# Patient Record
Sex: Female | Born: 1975 | Race: White | Hispanic: No | Marital: Married | Smoking: Former smoker
Health system: Southern US, Community
[De-identification: ages and names within clinical notes are randomized; demographics above are authoritative.]

---

## 2016-05-13 ENCOUNTER — Other Ambulatory Visit: Payer: Self-pay | Admitting: Family Medicine

## 2016-05-13 DIAGNOSIS — E041 Nontoxic single thyroid nodule: Secondary | ICD-10-CM

## 2016-05-14 ENCOUNTER — Other Ambulatory Visit: Payer: Self-pay | Admitting: Family Medicine

## 2016-05-14 DIAGNOSIS — E041 Nontoxic single thyroid nodule: Secondary | ICD-10-CM

## 2016-05-16 ENCOUNTER — Ambulatory Visit
Admission: RE | Admit: 2016-05-16 | Discharge: 2016-05-16 | Disposition: A | Discharge status: Home / Self Care | Payer: BLUE CROSS/BLUE SHIELD | Source: Ambulatory Visit | Attending: Family Medicine | Admitting: Family Medicine

## 2016-05-16 DIAGNOSIS — E041 Nontoxic single thyroid nodule: Secondary | ICD-10-CM

## 2016-05-23 ENCOUNTER — Ambulatory Visit
Admission: RE | Admit: 2016-05-23 | Discharge: 2016-05-23 | Disposition: A | Discharge status: Home / Self Care | Payer: BLUE CROSS/BLUE SHIELD | Source: Ambulatory Visit | Attending: Family Medicine | Admitting: Family Medicine

## 2016-05-23 ENCOUNTER — Other Ambulatory Visit (HOSPITAL_COMMUNITY)
Admission: RE | Admit: 2016-05-23 | Discharge: 2016-05-23 | Disposition: A | Discharge status: Home / Self Care | Payer: BLUE CROSS/BLUE SHIELD | Source: Ambulatory Visit | Attending: Radiology | Admitting: Radiology

## 2016-05-23 DIAGNOSIS — E041 Nontoxic single thyroid nodule: Secondary | ICD-10-CM | POA: Diagnosis not present

## 2018-07-22 ENCOUNTER — Other Ambulatory Visit: Payer: Self-pay | Admitting: Family Medicine

## 2018-07-22 DIAGNOSIS — R1314 Dysphagia, pharyngoesophageal phase: Secondary | ICD-10-CM

## 2018-08-12 ENCOUNTER — Other Ambulatory Visit: Payer: BLUE CROSS/BLUE SHIELD

## 2018-08-19 ENCOUNTER — Ambulatory Visit
Admission: RE | Admit: 2018-08-19 | Discharge: 2018-08-19 | Disposition: A | Discharge status: Home / Self Care | Payer: BLUE CROSS/BLUE SHIELD | Source: Ambulatory Visit | Attending: Family Medicine | Admitting: Family Medicine

## 2018-08-19 DIAGNOSIS — R1314 Dysphagia, pharyngoesophageal phase: Secondary | ICD-10-CM

## 2020-09-11 ENCOUNTER — Other Ambulatory Visit: Payer: Self-pay | Admitting: Family Medicine

## 2020-09-11 DIAGNOSIS — Z122 Encounter for screening for malignant neoplasm of respiratory organs: Secondary | ICD-10-CM

## 2020-10-23 ENCOUNTER — Other Ambulatory Visit: Payer: Self-pay | Admitting: Family Medicine

## 2020-10-23 ENCOUNTER — Ambulatory Visit
Admission: RE | Admit: 2020-10-23 | Discharge: 2020-10-23 | Disposition: A | Discharge status: Home / Self Care | Payer: BC Managed Care – PPO | Source: Ambulatory Visit | Attending: Family Medicine | Admitting: Family Medicine

## 2020-10-23 DIAGNOSIS — M542 Cervicalgia: Secondary | ICD-10-CM

## 2021-07-25 IMAGING — DX DG CERVICAL SPINE COMPLETE 4+V
6 series · 6 of 6 positions shown · non-contrast
Comparison: None.

CLINICAL DATA: Chronic neck pain for 1 year

EXAM:
CERVICAL SPINE - COMPLETE 4+ VIEW

[dg cervical spine complete (1 of 6)]
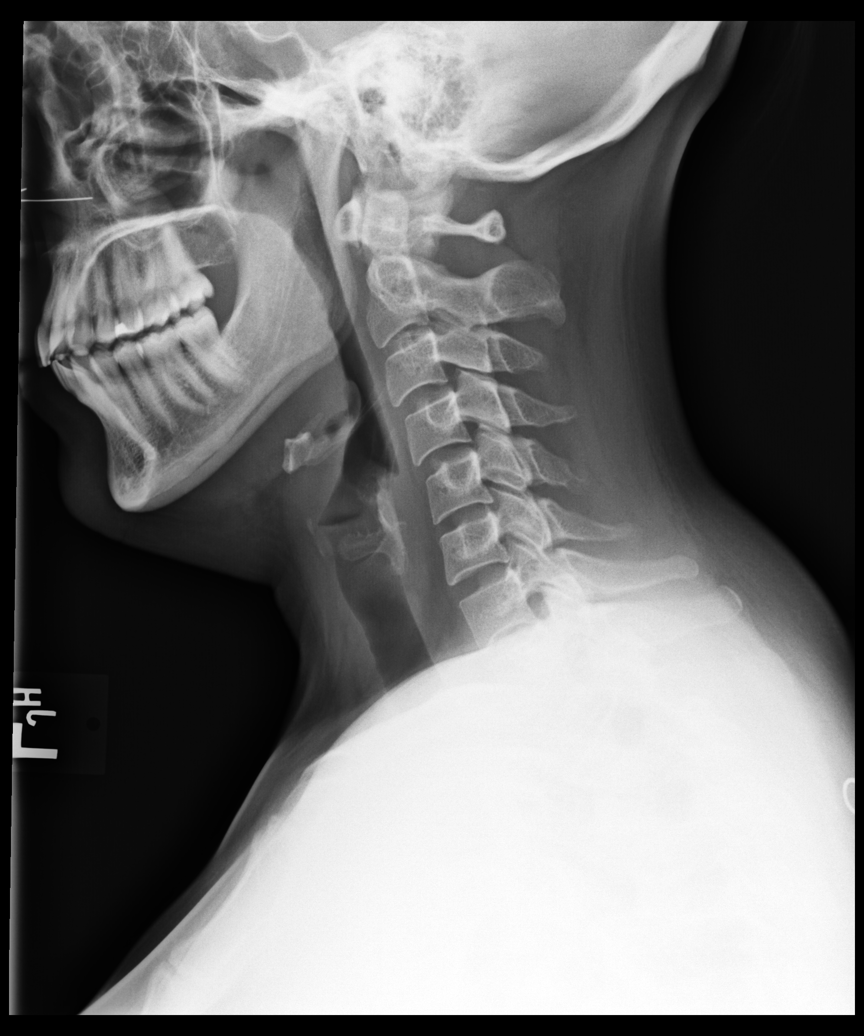

[dg cervical spine complete (2 of 6)]
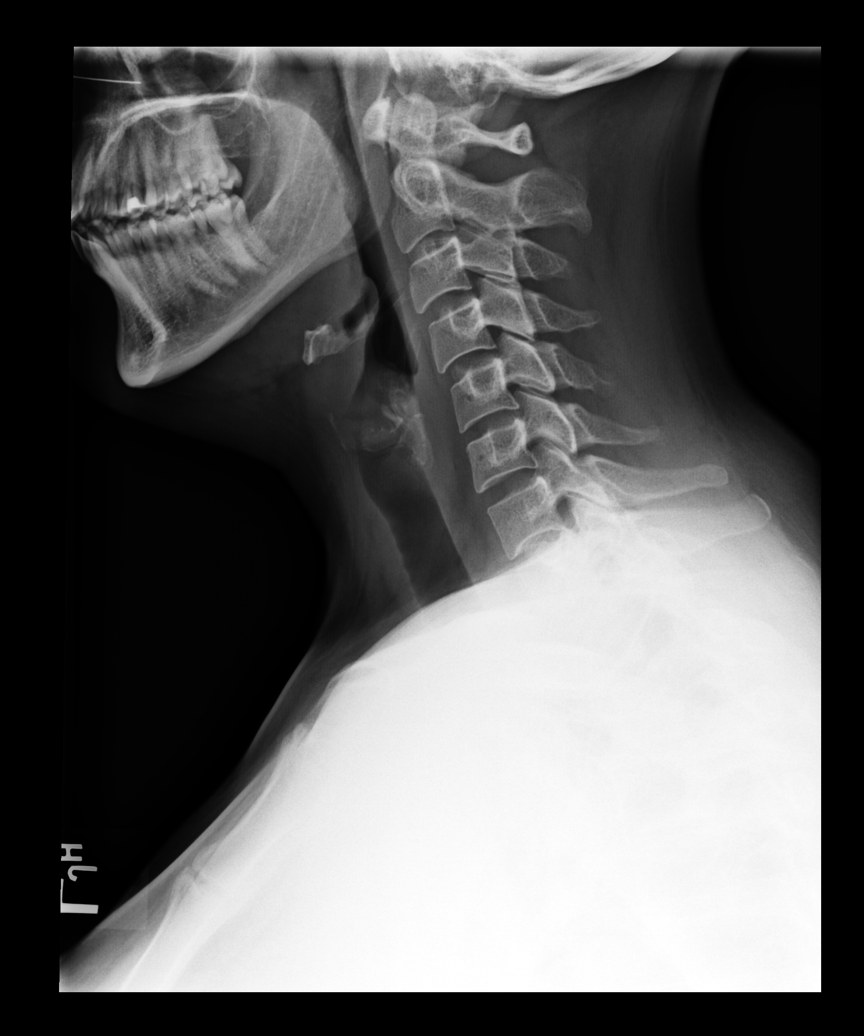

[dg cervical spine complete (3 of 6)]
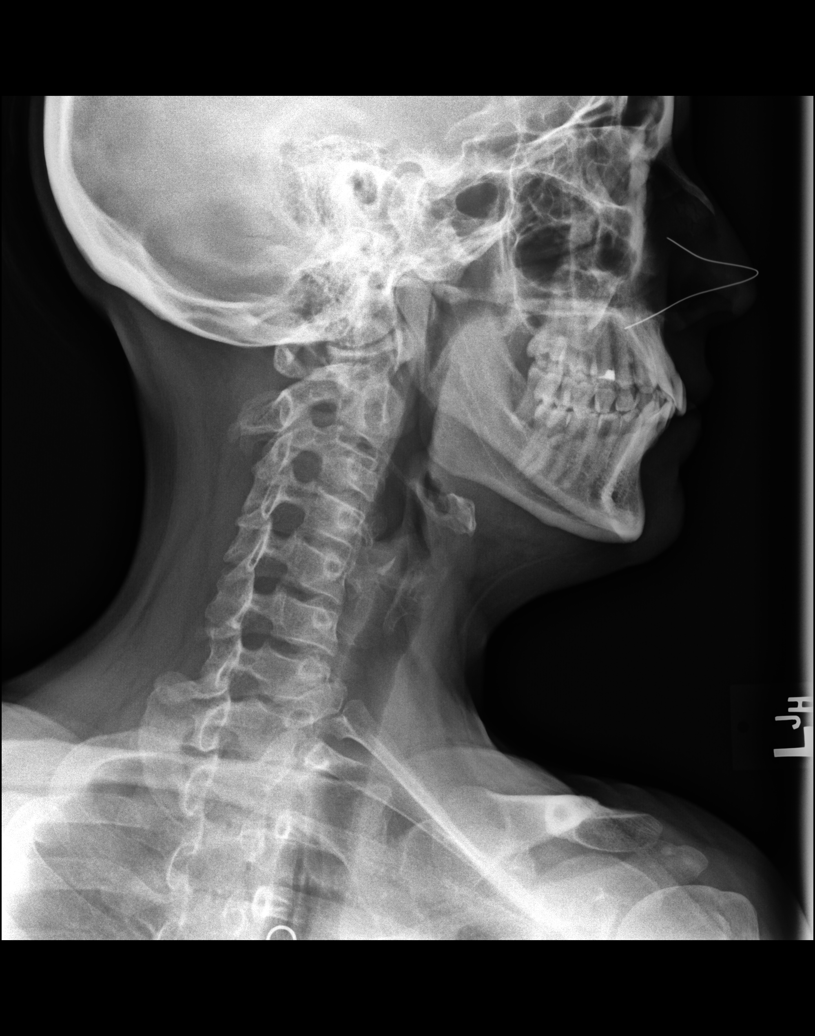

[dg cervical spine complete (4 of 6)]
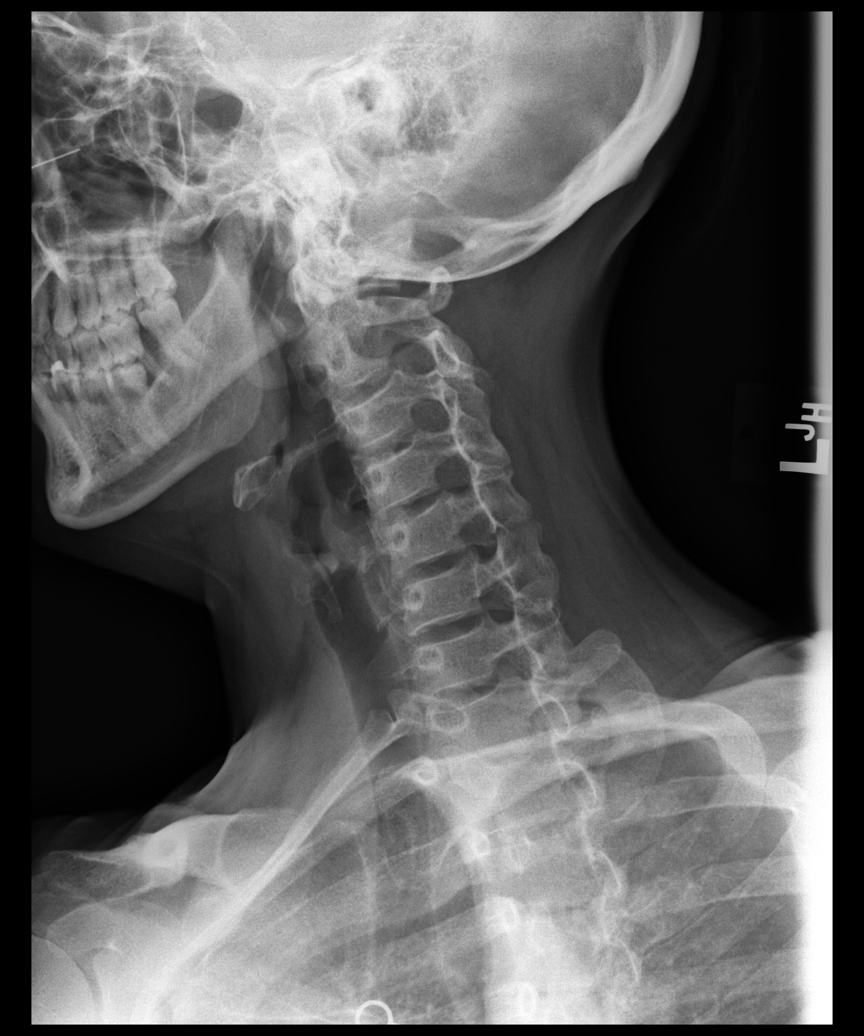

[dg cervical spine complete (5 of 6)]
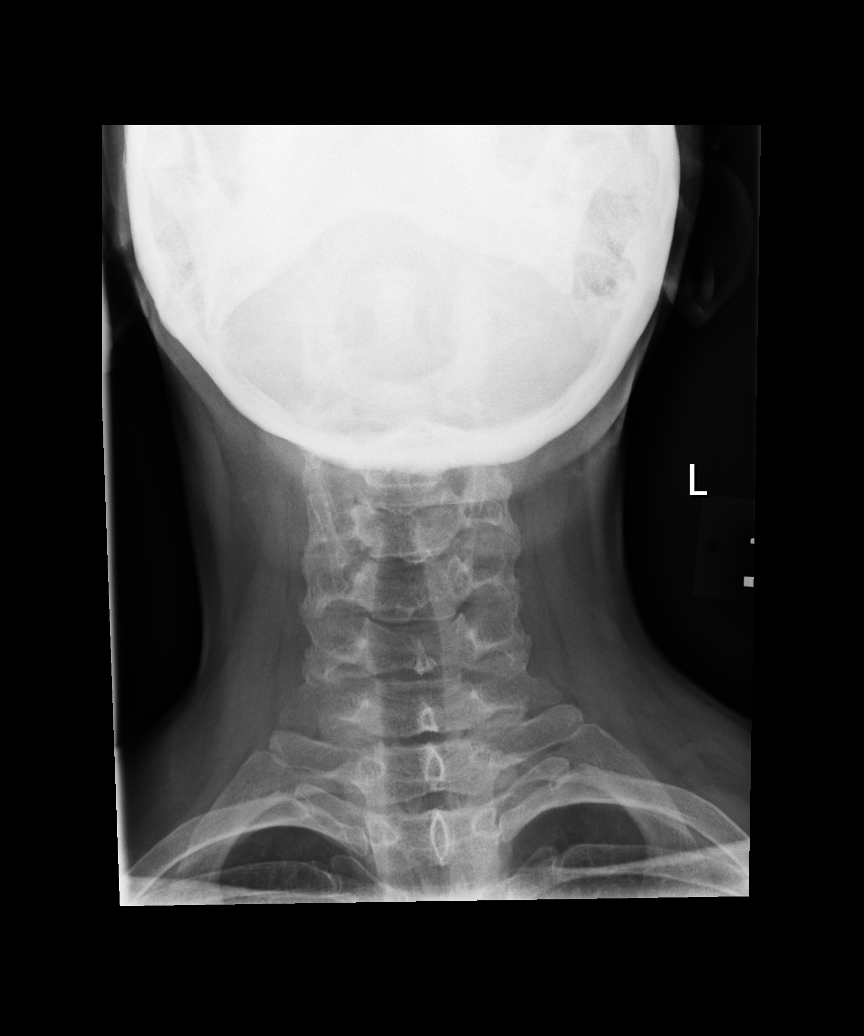

[dg cervical spine complete (6 of 6)]
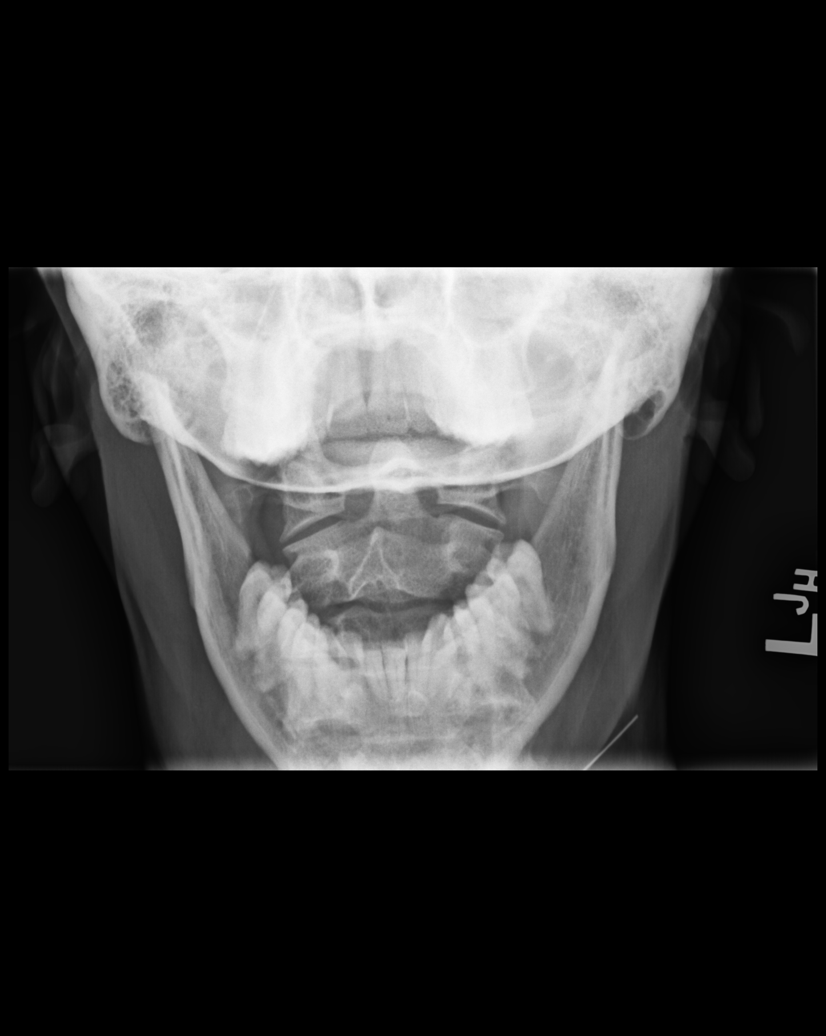

[6 of 6 positions shown; findings below may reference images not displayed]

FINDINGS: There is no evidence of cervical spine fracture or prevertebral soft
tissue swelling. Alignment is normal. No other significant bone
abnormalities are identified.
IMPRESSION: Negative cervical spine radiographs.

## 2021-07-27 DIAGNOSIS — M50223 Other cervical disc displacement at C6-C7 level: Secondary | ICD-10-CM | POA: Diagnosis not present

## 2021-07-27 DIAGNOSIS — M5412 Radiculopathy, cervical region: Secondary | ICD-10-CM | POA: Diagnosis not present

## 2021-08-02 DIAGNOSIS — G4486 Cervicogenic headache: Secondary | ICD-10-CM | POA: Diagnosis not present

## 2021-08-02 DIAGNOSIS — M542 Cervicalgia: Secondary | ICD-10-CM | POA: Diagnosis not present

## 2021-08-02 DIAGNOSIS — Z6824 Body mass index (BMI) 24.0-24.9, adult: Secondary | ICD-10-CM | POA: Diagnosis not present

## 2021-08-02 DIAGNOSIS — I10 Essential (primary) hypertension: Secondary | ICD-10-CM | POA: Diagnosis not present

## 2021-08-16 ENCOUNTER — Ambulatory Visit (INDEPENDENT_AMBULATORY_CARE_PROVIDER_SITE_OTHER): Payer: BC Managed Care – PPO | Admitting: Neurology

## 2021-08-16 ENCOUNTER — Encounter: Payer: Self-pay | Admitting: Neurology

## 2021-08-16 VITALS — BP 113/78 | HR 64 | Ht 67.0 in | Wt 160.0 lb

## 2021-08-16 DIAGNOSIS — R519 Headache, unspecified: Secondary | ICD-10-CM | POA: Diagnosis not present

## 2021-08-16 DIAGNOSIS — G43709 Chronic migraine without aura, not intractable, without status migrainosus: Secondary | ICD-10-CM | POA: Diagnosis not present

## 2021-08-16 MED ORDER — NORTRIPTYLINE HCL 10 MG PO CAPS: 20.0000 mg | ORAL_CAPSULE | Freq: Every day | ORAL | 11 refills | Status: DC

## 2021-08-16 MED ORDER — RIZATRIPTAN BENZOATE 5 MG PO TBDP: 5.0000 mg | ORAL_TABLET | ORAL | 11 refills | Status: DC | PRN

## 2021-08-16 MED ORDER — ONDANSETRON 4 MG PO TBDP: 4.0000 mg | ORAL_TABLET | Freq: Three times a day (TID) | ORAL | 6 refills | Status: DC | PRN

## 2021-08-16 MED ORDER — TIZANIDINE HCL 4 MG PO TABS: 4.0000 mg | ORAL_TABLET | Freq: Four times a day (QID) | ORAL | 3 refills | Status: DC | PRN

## 2021-08-16 NOTE — Progress Notes (Signed)
Chief Complaint  Patient presents with   New Patient (Initial Visit)    Rm 16, reports neck pain on left and right side, started 2 years ago but the flare ups have increased in the last 7-8 months       ASSESSMENT AND PLAN  Angela Goodwin is a 45 y.o. female  Worsening more frequent headaches,  Difficulty focusing, difficulty multitasking,  Patient desired further evaluation to rule out structural abnormality, MRI of the brain without contrast  Chronic migraine Neck pain  Start preventive medication nortriptyline 10 mg titrating to 20 mg every night  Maxalt 5 mg dissolvable as needed, may combine with tizanidine, Zofran, Aleve for prolonged severe headaches,  Magnesium oxide 400 mg twice a day plus riboflavin 100 mg twice a day as migraine prevention   DIAGNOSTIC DATA (LABS, IMAGING, TESTING) - I reviewed patient records, labs, notes, testing and imaging myself where available.  MRI of cervical spine July 27, 2021 from [facility] 1. Small left paracentral protrusions at C6-7 and to a lesser extent  at C5-6.  2. Intermittent motion artifact.   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 Aura Dials, for evaluation of frequent headache, neck pain, initial evaluation was on August 16, 2021.    I reviewed and summarized the referring note. PMHX.  She had intermittent headache over the past 10 years, often starting at the neck, spreading forward, retro-orbital area pounding headache, pressure, with light noise sensitivity, worsening by movement, lasting for few hours, she has been taking ibuprofen as needed, which was helpful  Since 2020, she had increased  frequency of headache, also neck pain, especially since 2022, she is now having constant variable degree of neck pressure, often felt catch if she moves suddenly, more frequent headache, starting from the neck, become retro-orbital area headache, to 3 times each week,  In addition, she is concerned about difficulty focusing difficulty performing multitasking, with increased frequency of headache, she is very worried about the structural abnormality,   PHYSICAL EXAM:   Vitals:   08/16/21 0802  BP: 113/78  Pulse: 64  Weight: 160 lb (72.6 kg)  Height: _0  (1.702 m)   Not recorded     Body mass index is 25.06 kg/m.  PHYSICAL EXAMNIATION:  Gen: NAD, conversant, well nourised, well groomed                     Cardiovascular: Regular rate rhythm, no peripheral edema, warm, nontender. Eyes: Conjunctivae clear without exudates or hemorrhage Neck: Supple, no carotid bruits. Pulmonary: Clear to auscultation bilaterally   NEUROLOGICAL EXAM:  MENTAL STATUS: Speech:    Speech is normal; fluent and spontaneous with normal comprehension.  Cognition:     Orientation to time, place and person     Normal recent and remote memory     Normal Attention span and concentration     Normal Language, naming, repeating,spontaneous speech     Fund of knowledge   CRANIAL NERVES: CN II: Visual fields are full to confrontation. Pupils are round equal and briskly reactive to light. CN III, IV, VI: extraocular movement are normal. No ptosis. CN V: Facial sensation is intact to light touch CN VII: Face is symmetric with  normal eye closure  CN VIII: Hearing is normal to causal conversation. CN IX, X: Phonation is normal. CN XI: Head turning and shoulder shrug are intact  MOTOR: There is no pronator drift of out-stretched arms. Muscle bulk and tone are normal. Muscle strength is normal.  REFLEXES: Reflexes are 2+ and symmetric at the biceps, triceps, knees, and ankles. Plantar responses are  flexor.  SENSORY: Intact to light touch, pinprick and vibratory sensation are intact in fingers and toes.  COORDINATION: There is no trunk or limb dysmetria noted.  GAIT/STANCE: Posture is normal. Gait is steady with normal steps, base, arm swing, and turning. Heel and toe walking are normal. Tandem gait is normal.  Romberg is absent.  REVIEW OF SYSTEMS:  Full 14 system review of systems performed and notable only for as above All other review of systems were negative.   ALLERGIES: No Known Allergies  HOME MEDICATIONS: Current Outpatient Medications  Medication Sig Dispense Refill   IBUPROFEN PO Take by mouth.     No current facility-administered medications for this visit.    PAST MEDICAL HISTORY: History reviewed. No pertinent past medical history.  PAST SURGICAL HISTORY: History reviewed. No pertinent surgical history.  FAMILY HISTORY: History reviewed. No pertinent family history.  SOCIAL HISTORY: Social History   Socioeconomic History   Marital status: Married    Spouse name: Not on file   Number of children: Not on file   Years of education: Not on file   Highest education level: Not on file  Occupational History   Not on file  Tobacco Use   Smoking status: Former    Types: Cigarettes   Smokeless tobacco: Never  Substance and Sexual Activity   Alcohol use: Not on file   Drug use: Never   Sexual activity: Yes  Other Topics Concern   Not on file  Social History Narrative   Not on file   Social Determinants of Health   Financial Resource Strain: Not on file  Food Insecurity: Not on file  Transportation Needs: Not on file  Physical Activity: Not on file  Stress: Not on file  Social Connections: Not on file  Intimate Partner Violence: Not on file      Marcial Pacas, M.D. Ph.D.  [facility] [address] [place], [place] [address] Ph: [phone] Fax: [phone]  CC:  Aura Dials, MD [place],  [place] [address]  Aura Dials, MD

## 2021-08-21 ENCOUNTER — Telehealth: Payer: Self-pay | Admitting: Neurology

## 2021-08-21 NOTE — Telephone Encounter (Signed)
[employer] Berkley Harvey: [id number] (exp. 08/21/21 to 10/19/21) order sent to [facility] because [facility] is not in network.

## 2021-08-29 DIAGNOSIS — Z1231 Encounter for screening mammogram for malignant neoplasm of breast: Secondary | ICD-10-CM | POA: Diagnosis not present

## 2021-08-29 DIAGNOSIS — M542 Cervicalgia: Secondary | ICD-10-CM | POA: Diagnosis not present

## 2021-09-05 DIAGNOSIS — M542 Cervicalgia: Secondary | ICD-10-CM | POA: Diagnosis not present

## 2021-09-12 ENCOUNTER — Other Ambulatory Visit: Payer: Self-pay

## 2021-09-12 ENCOUNTER — Ambulatory Visit
Admission: RE | Admit: 2021-09-12 | Discharge: 2021-09-12 | Disposition: A | Discharge status: Home / Self Care | Payer: BC Managed Care – PPO | Source: Ambulatory Visit | Attending: Neurology | Admitting: Neurology

## 2021-09-12 DIAGNOSIS — R519 Headache, unspecified: Secondary | ICD-10-CM

## 2021-09-12 DIAGNOSIS — G43709 Chronic migraine without aura, not intractable, without status migrainosus: Secondary | ICD-10-CM

## 2021-09-13 ENCOUNTER — Telehealth: Payer: Self-pay | Admitting: Neurology

## 2021-09-13 NOTE — Telephone Encounter (Signed)
  IMPRESSION: Unremarkable MRI scan of the brain without contrast.  Incidental few left subcortical nonspecific white matter hyperintensities with differential discussed above and changes of chronic paranasal sinusitis are noted.  Please call patient MRI of the brain without contrast showed no significant abnormalities.

## 2021-09-13 NOTE — Telephone Encounter (Signed)
Attempted to call pt, LVM for normal results per DPR. Ask pt to call back for questions or concerns.  

## 2021-09-19 DIAGNOSIS — M542 Cervicalgia: Secondary | ICD-10-CM | POA: Diagnosis not present

## 2021-10-03 DIAGNOSIS — M542 Cervicalgia: Secondary | ICD-10-CM | POA: Diagnosis not present

## 2021-10-10 DIAGNOSIS — M542 Cervicalgia: Secondary | ICD-10-CM | POA: Diagnosis not present

## 2021-10-17 DIAGNOSIS — L814 Other melanin hyperpigmentation: Secondary | ICD-10-CM | POA: Diagnosis not present

## 2021-10-17 DIAGNOSIS — M542 Cervicalgia: Secondary | ICD-10-CM | POA: Diagnosis not present

## 2021-10-17 DIAGNOSIS — L810 Postinflammatory hyperpigmentation: Secondary | ICD-10-CM | POA: Diagnosis not present

## 2021-10-17 DIAGNOSIS — D229 Melanocytic nevi, unspecified: Secondary | ICD-10-CM | POA: Diagnosis not present

## 2021-10-17 DIAGNOSIS — X32XXXS Exposure to sunlight, sequela: Secondary | ICD-10-CM | POA: Diagnosis not present

## 2021-10-18 DIAGNOSIS — J358 Other chronic diseases of tonsils and adenoids: Secondary | ICD-10-CM | POA: Diagnosis not present

## 2021-10-19 DIAGNOSIS — M2550 Pain in unspecified joint: Secondary | ICD-10-CM | POA: Diagnosis not present

## 2021-10-19 DIAGNOSIS — Z0000 Encounter for general adult medical examination without abnormal findings: Secondary | ICD-10-CM | POA: Diagnosis not present

## 2021-10-19 DIAGNOSIS — L603 Nail dystrophy: Secondary | ICD-10-CM | POA: Diagnosis not present

## 2021-11-28 ENCOUNTER — Telehealth: Payer: Self-pay | Admitting: Neurology

## 2021-11-28 NOTE — Telephone Encounter (Signed)
VM was left for patient on 1/31 to call back to reschedule. OK for patient to see NP per Dr. Terrace Arabia.

## 2021-12-17 ENCOUNTER — Ambulatory Visit: Payer: BC Managed Care – PPO | Admitting: Neurology

## 2022-03-28 DIAGNOSIS — Z1151 Encounter for screening for human papillomavirus (HPV): Secondary | ICD-10-CM | POA: Diagnosis not present

## 2022-03-28 DIAGNOSIS — Z01419 Encounter for gynecological examination (general) (routine) without abnormal findings: Secondary | ICD-10-CM | POA: Diagnosis not present

## 2022-10-23 DIAGNOSIS — M533 Sacrococcygeal disorders, not elsewhere classified: Secondary | ICD-10-CM | POA: Diagnosis not present

## 2022-10-23 DIAGNOSIS — M545 Low back pain, unspecified: Secondary | ICD-10-CM | POA: Diagnosis not present

## 2022-12-04 DIAGNOSIS — Z1231 Encounter for screening mammogram for malignant neoplasm of breast: Secondary | ICD-10-CM | POA: Diagnosis not present

## 2023-05-29 DIAGNOSIS — Z01419 Encounter for gynecological examination (general) (routine) without abnormal findings: Secondary | ICD-10-CM | POA: Diagnosis not present

## 2023-08-17 ENCOUNTER — Ambulatory Visit
Admission: RE | Admit: 2023-08-17 | Discharge: 2023-08-17 | Disposition: A | Discharge status: Home / Self Care | Payer: BC Managed Care – PPO | Source: Ambulatory Visit | Attending: Family Medicine | Admitting: Family Medicine

## 2023-08-17 ENCOUNTER — Other Ambulatory Visit: Payer: Self-pay

## 2023-08-17 ENCOUNTER — Ambulatory Visit (INDEPENDENT_AMBULATORY_CARE_PROVIDER_SITE_OTHER): Payer: BC Managed Care – PPO

## 2023-08-17 VITALS — BP 126/79 | HR 66 | Temp 98.5°F | Resp 18

## 2023-08-17 DIAGNOSIS — W19XXXA Unspecified fall, initial encounter: Secondary | ICD-10-CM

## 2023-08-17 DIAGNOSIS — Z043 Encounter for examination and observation following other accident: Secondary | ICD-10-CM | POA: Diagnosis not present

## 2023-08-17 DIAGNOSIS — R079 Chest pain, unspecified: Secondary | ICD-10-CM

## 2023-08-17 DIAGNOSIS — S299XXA Unspecified injury of thorax, initial encounter: Secondary | ICD-10-CM | POA: Diagnosis not present

## 2023-08-17 MED ORDER — CELECOXIB 200 MG PO CAPS: 200.0000 mg | ORAL_CAPSULE | Freq: Every day | ORAL | 0 refills | Status: AC

## 2023-08-17 NOTE — ED Triage Notes (Signed)
Patient reports she had a fall (yesterday morning) while running in a race. The patient states she noticed a new onset of a sharp pain to left ribcage/upper abd and upper back pain when she inhales. Denies [facility] but is taking more shallow breaths to avoid the sharp pain with inhalation.  Home interventions: ibuprofen (last taken 1 hr ago)

## 2023-08-17 NOTE — Discharge Instructions (Addendum)
Advised patient of chest x-ray results with hardcopy/image read provided.  Advised patient to take medication as directed with food to completion.  Encouraged to increase daily water intake to 64 ounces per day while taking this medication.  Advised if symptoms worsen and/or unresolved please follow-up with PCP or here for further evaluation.

## 2023-08-17 NOTE — ED Provider Notes (Signed)
[facility]    CSN: [id number] Arrival date & time: 08/17/23  0945      History   Chief Complaint Chief Complaint  Patient presents with   Chest Injury    Larey Seat and now have pain on left side of chest when moving or breathing - Entered by patient   Fall    HPI Angela Goodwin is a 47 y.o. female.   HPI Pleasant 47 year old female presents with chest wall pain secondary to falling during a race yesterday.  Patient reports sharp pain in left rib cage upper abdominal area that radiates to her back.  PMH significant for migraine.  History reviewed. No pertinent past medical history.  Patient Active Problem List   Diagnosis Date Noted   Chronic migraine w/o aura w/o status migrainosus, not intractable 08/16/2021    History reviewed. No pertinent surgical history.  OB History   No obstetric history on file.      Home Medications    Prior to Admission medications   Medication Sig Start Date End Date Taking? Authorizing Provider  celecoxib (CELEBREX) 200 MG capsule Take 1 capsule (200 mg total) by mouth daily for 15 days. 08/17/23 09/01/23 Yes Trevor Iha, FNP    Family History History reviewed. No pertinent family history.  Social History Social History   Tobacco Use   Smoking status: Former    Types: Cigarettes   Smokeless tobacco: Never  Substance Use Topics   Drug use: Never     Allergies   Patient has no known allergies.   Review of Systems Review of Systems  Musculoskeletal:        Left-sided chest wall pain secondary to fall while running and race yesterday, Saturday, 08/16/2023     Physical Exam Triage Vital Signs ED Triage Vitals  Encounter Vitals Group     BP 08/17/23 1006 126/79     Systolic BP Percentile --      Diastolic BP Percentile --      Pulse Rate 08/17/23 1006 66     Resp 08/17/23 1006 18     Temp 08/17/23 1006 98.5 F (36.9 C)     Temp Source 08/17/23 1006 Oral     SpO2 08/17/23 1006 100 %     Weight --       Height --      Head Circumference --      Peak Flow --      Pain Score 08/17/23 1005 7     Pain Loc --      Pain Education --      Exclude from Growth Chart --    No data found.  Updated Vital Signs BP 126/79 (BP Location: Right Arm)   Pulse 66   Temp 98.5 F (36.9 C) (Oral)   Resp 18   LMP 08/02/2023 (Exact Date)   SpO2 100%    Physical Exam Vitals and nursing note reviewed.  Constitutional:      Appearance: Normal appearance. She is normal weight.  HENT:     Head: Normocephalic and atraumatic.     Mouth/Throat:     Mouth: Mucous membranes are moist.     Pharynx: Oropharynx is clear.  Eyes:     Extraocular Movements: Extraocular movements intact.     Conjunctiva/sclera: Conjunctivae normal.     Pupils: Pupils are equal, round, and reactive to light.  Cardiovascular:     Rate and Rhythm: Normal rate and regular rhythm.     Pulses: Normal pulses.  Heart sounds: Normal heart sounds.  Pulmonary:     Effort: Pulmonary effort is normal.     Breath sounds: Normal breath sounds. No wheezing, rhonchi or rales.  Musculoskeletal:        General: Normal range of motion.     Cervical back: Normal range of motion and neck supple.     Comments: Left anterior rib cage: TTP over 5th through 7th ribs  Skin:    General: Skin is warm and dry.  Neurological:     General: No focal deficit present.     Mental Status: She is alert and oriented to person, place, and time. Mental status is at baseline.  Psychiatric:        Mood and Affect: Mood normal.        Behavior: Behavior normal.        Thought Content: Thought content normal.      UC Treatments / Results  Labs (all labs ordered are listed, but only abnormal results are displayed) Labs Reviewed - No data to display  EKG   Radiology DG Chest 2 View  Result Date: 08/17/2023 CLINICAL DATA:  Fall on left side yesterday EXAM: CHEST - 2 VIEW COMPARISON:  None Available. FINDINGS: The heart size and mediastinal contours  are within normal limits. Both lungs are clear. The visualized skeletal structures are unremarkable. IMPRESSION: No active cardiopulmonary disease. Electronically Signed   By: Allegra Lai M.D.   On: 08/17/2023 11:09    Procedures Procedures (including critical care time)  Medications Ordered in UC Medications - No data to display  Initial Impression / Assessment and Plan / UC Course  I have reviewed the triage vital signs and the nursing notes.  Pertinent labs & imaging results that were available during my care of the patient were reviewed by me and considered in my medical decision making (see chart for details).     MDM: 1.  Chest wall injury, initial encounter-CXR revealed above, Rx'd Celebrex 200 mg capsule: Take 1 capsule daily x 15 days; 2.  Fall, initial encounter-patient reports falling during road race yesterday morning Saturday, 08/16/2023 causing rib contusion. Advised patient of chest x-ray results with hardcopy/image read provided.  Advised patient to take medication as directed with food to completion.  Encouraged to increase daily water intake to 64 ounces per day while taking this medication.  Advised if symptoms worsen and/or unresolved please follow-up with PCP or here for further evaluation.  Patient discharged home, hemodynamically stable.  Final Clinical Impressions(s) / UC Diagnoses   Final diagnoses:  Fall, initial encounter  Chest wall injury, initial encounter     Discharge Instructions      Advised patient of chest x-ray results with hardcopy/image read provided.  Advised patient to take medication as directed with food to completion.  Encouraged to increase daily water intake to 64 ounces per day while taking this medication.  Advised if symptoms worsen and/or unresolved please follow-up with PCP or here for further evaluation.     ED Prescriptions     Medication Sig Dispense Auth. Provider   celecoxib (CELEBREX) 200 MG capsule Take 1 capsule (200  mg total) by mouth daily for 15 days. 15 capsule Trevor Iha, FNP      PDMP not reviewed this encounter.   Trevor Iha, FNP 08/17/23 1206

## 2024-06-11 ENCOUNTER — Other Ambulatory Visit (HOSPITAL_BASED_OUTPATIENT_CLINIC_OR_DEPARTMENT_OTHER): Payer: Self-pay | Admitting: Family Medicine

## 2024-06-11 DIAGNOSIS — S322XXS Fracture of coccyx, sequela: Secondary | ICD-10-CM

## 2024-06-16 ENCOUNTER — Ambulatory Visit (HOSPITAL_BASED_OUTPATIENT_CLINIC_OR_DEPARTMENT_OTHER)
Admission: RE | Admit: 2024-06-16 | Discharge: 2024-06-16 | Disposition: A | Discharge status: Home / Self Care | Source: Ambulatory Visit | Attending: Family Medicine | Admitting: Family Medicine

## 2024-06-16 DIAGNOSIS — M533 Sacrococcygeal disorders, not elsewhere classified: Secondary | ICD-10-CM | POA: Diagnosis not present

## 2024-06-16 DIAGNOSIS — W19XXXS Unspecified fall, sequela: Secondary | ICD-10-CM | POA: Diagnosis not present

## 2024-06-16 DIAGNOSIS — S322XXS Fracture of coccyx, sequela: Secondary | ICD-10-CM | POA: Diagnosis present
